# Patient Record
Sex: Male | Born: 1955 | Race: Black or African American | Hispanic: No | Marital: Married | Smoking: Former smoker
Health system: Southern US, Community
[De-identification: ages and names within clinical notes are randomized; demographics above are authoritative.]

## PROBLEM LIST (undated history)

## (undated) DIAGNOSIS — E7800 Pure hypercholesterolemia, unspecified: Secondary | ICD-10-CM

## (undated) DIAGNOSIS — Z87442 Personal history of urinary calculi: Secondary | ICD-10-CM

## (undated) DIAGNOSIS — I10 Essential (primary) hypertension: Secondary | ICD-10-CM

## (undated) DIAGNOSIS — G4700 Insomnia, unspecified: Secondary | ICD-10-CM

## (undated) HISTORY — PX: OTHER SURGICAL HISTORY: SHX169

## (undated) HISTORY — PX: NOSE SURGERY: SHX723

---

## 1977-10-18 HISTORY — PX: CYST EXCISION: SHX5701

## 2001-10-18 HISTORY — PX: HERNIA REPAIR: SHX51

## 2018-06-27 ENCOUNTER — Other Ambulatory Visit: Payer: Self-pay | Admitting: Urology

## 2018-07-03 ENCOUNTER — Encounter (HOSPITAL_COMMUNITY): Payer: Self-pay | Admitting: General Practice

## 2018-07-06 ENCOUNTER — Ambulatory Visit (HOSPITAL_COMMUNITY)
Admission: RE | Admit: 2018-07-06 | Discharge: 2018-07-06 | Disposition: A | Discharge status: Home / Self Care | Payer: Managed Care, Other (non HMO) | Source: Ambulatory Visit | Attending: Urology | Admitting: Urology

## 2018-07-06 ENCOUNTER — Other Ambulatory Visit: Payer: Self-pay

## 2018-07-06 ENCOUNTER — Encounter (HOSPITAL_COMMUNITY)
Admission: RE | Disposition: A | Discharge status: Home / Self Care | Payer: Self-pay | Source: Ambulatory Visit | Attending: Urology

## 2018-07-06 ENCOUNTER — Ambulatory Visit (HOSPITAL_COMMUNITY): Payer: Managed Care, Other (non HMO)

## 2018-07-06 ENCOUNTER — Encounter (HOSPITAL_COMMUNITY): Payer: Self-pay | Admitting: *Deleted

## 2018-07-06 DIAGNOSIS — N200 Calculus of kidney: Secondary | ICD-10-CM | POA: Insufficient documentation

## 2018-07-06 DIAGNOSIS — I499 Cardiac arrhythmia, unspecified: Secondary | ICD-10-CM | POA: Insufficient documentation

## 2018-07-06 DIAGNOSIS — I10 Essential (primary) hypertension: Secondary | ICD-10-CM | POA: Insufficient documentation

## 2018-07-06 DIAGNOSIS — Z79899 Other long term (current) drug therapy: Secondary | ICD-10-CM | POA: Diagnosis not present

## 2018-07-06 HISTORY — DX: Insomnia, unspecified: G47.00

## 2018-07-06 HISTORY — PX: EXTRACORPOREAL SHOCK WAVE LITHOTRIPSY: SHX1557

## 2018-07-06 HISTORY — DX: Pure hypercholesterolemia, unspecified: E78.00

## 2018-07-06 HISTORY — DX: Personal history of urinary calculi: Z87.442

## 2018-07-06 HISTORY — DX: Essential (primary) hypertension: I10

## 2018-07-06 SURGERY — LITHOTRIPSY, ESWL
Anesthesia: LOCAL | Laterality: Right

## 2018-07-06 MED ORDER — SODIUM CHLORIDE 0.9 % IV SOLN
INTRAVENOUS | Status: DC
  Administered 2018-07-06: 09:00:00 via INTRAVENOUS

## 2018-07-06 MED ORDER — DIPHENHYDRAMINE HCL 25 MG PO CAPS
25.0000 mg | ORAL_CAPSULE | ORAL | Status: AC
  Administered 2018-07-06: 25 mg via ORAL
  Filled 2018-07-06: qty 1

## 2018-07-06 MED ORDER — DIAZEPAM 5 MG PO TABS
10.0000 mg | ORAL_TABLET | ORAL | Status: AC
  Administered 2018-07-06: 10 mg via ORAL
  Filled 2018-07-06: qty 2

## 2018-07-06 MED ORDER — CIPROFLOXACIN HCL 500 MG PO TABS
500.0000 mg | ORAL_TABLET | ORAL | Status: AC
  Administered 2018-07-06: 500 mg via ORAL
  Filled 2018-07-06: qty 1

## 2018-07-10 ENCOUNTER — Encounter (HOSPITAL_COMMUNITY): Payer: Self-pay | Admitting: Urology

## 2019-08-01 HISTORY — PX: APPENDECTOMY: SHX54

## 2019-08-28 ENCOUNTER — Other Ambulatory Visit: Payer: Self-pay | Admitting: Urology

## 2019-08-29 ENCOUNTER — Encounter (HOSPITAL_COMMUNITY): Payer: Self-pay | Admitting: General Practice

## 2019-08-31 NOTE — H&P (Signed)
Office Visit Report     08/28/2019   --------------------------------------------------------------------------------   Bob Moore  MRN: [id number]  DOB: 1956-06-13, 63 year old Male  SSN:    PRIMARY CARE:  Stoney Bang  REFERRING:  Stoney Bang  PROVIDER:  Louis Meckel, M.D.  LOCATION:  [facility] [phone]     --------------------------------------------------------------------------------   CC: f/u for obstructing stone  HPI: Bob Moore is a 63 year-old male established patient who is here for further eval and management of an obstructing stone.  The patient was last seen July 2019.   The patient's stone is on his left side. The stone was 57mm left mid ureter. There are additional stones within the urinary tract. They are located 2 non-obstructing right lower pole stones.   The patient has not passed their stone since his visit. The patient denies fevers, chills, nausea, vomiting, flank pain, groin pain, or progressive voiding symptoms. The patient underwent KUB prior to today's appointment. The patient has been taking tamsulosin for their obstructing stone.   CT scan done on 10/14 for right lower quadrant pain. Found to have acute appendicitis and left obstructing stone. s/p lap appy on 10/14. The patient is having no symptoms of his left kidney stone. He never really has. It was basically an incidental finding.     ALLERGIES: None   MEDICATIONS: Simvastatin  Tamsulosin Hcl 0.4 mg capsule  Centrum Silver  Diltiazem 24Hr Er  Fish Oil 1,000 mg (120 mg-180 mg) capsule  Lo-Dose Aspirin Ec  Losartan Potassium  Tadalafil     GU PSH: ESWL, Right - 07/06/2018       PSH Notes: parotid gland [id number]), collapsed lung [id number]), hernia x2 (2003)   NON-GU PSH: Appendectomy (laparoscopic), 07/2019     GU PMH: Renal calculus - 09/28/2018, - 08/04/2018, - 07/20/2018 Ureteral calculus - 06/27/2018    NON-GU PMH:  Hypercholesterolemia Hypertension Liver Disease Stroke/TIA    FAMILY HISTORY: 2 sons - Son Diabetes - Mother Kidney Failure - Mother Lung Cancer - Father Prostate Cancer - Father   SOCIAL HISTORY: Marital Status: Married Preferred Language: English; Ethnicity: Not Hispanic Or Latino; Race: Black or African American Current Smoking Status: Patient does not smoke anymore. Has not smoked since 06/18/2005. Smoked for 25 years. Smoked 1/2 pack per day.   Tobacco Use Assessment Completed: Used Tobacco in last 30 days? Has never drank.  Drinks 1 caffeinated drink per day. Patient's occupation Scientist, clinical (histocompatibility and immunogenetics).    REVIEW OF SYSTEMS:    GU Review Male:   Patient denies frequent urination, hard to postpone urination, burning/ pain with urination, get up at night to urinate, leakage of urine, stream starts and stops, trouble starting your stream, have to strain to urinate , erection problems, and penile pain.  Gastrointestinal (Upper):   Patient denies indigestion/ heartburn, nausea, and vomiting.  Gastrointestinal (Lower):   Patient denies diarrhea and constipation.  Constitutional:   Patient denies fever, night sweats, weight loss, and fatigue.  Skin:   Patient denies skin rash/ lesion and itching.  Eyes:   Patient denies blurred vision and double vision.  Ears/ Nose/ Throat:   Patient denies sore throat and sinus problems.  Hematologic/Lymphatic:   Patient denies swollen glands and easy bruising.  Cardiovascular:   Patient denies leg swelling and chest pains.  Respiratory:   Patient denies cough and shortness of breath.  Endocrine:   Patient denies excessive thirst.  Musculoskeletal:   Patient denies back pain and joint pain.  Neurological:  Patient denies headaches and dizziness.  Psychologic:   Patient denies depression and anxiety.   VITAL SIGNS:      08/28/2019 10:11 AM  Weight 205 lb / 92.99 kg  Height 71 in / 180.34 cm  BP 153/95 mmHg  Pulse 60 /min  Temperature  97.3 F / 36.2 C  BMI 28.6 kg/m   MULTI-SYSTEM PHYSICAL EXAMINATION:    Constitutional: Well-nourished. No physical deformities. Normally developed. Good grooming.  Respiratory: Normal breath sounds. No labored breathing, no use of accessory muscles.   Cardiovascular: Regular rate and rhythm. No murmur, no gallop. Normal temperature, normal extremity pulses, no swelling, no varicosities.      PAST DATA REVIEWED:  Source Of History:  Patient  Lab Test Review:   Stone Analysis  Records Review:   Previous Doctor Records, Previous Hospital Records, Previous Patient Records, POC Tool  X-Ray Review: C.T. Abdomen/Pelvis: Reviewed Films. Discussed With Patient. 7 mm left mid ureteral stone, 2 nonobstructing stones in the right lower pole    PROCEDURES:         KUB - [id number]  A single view of the abdomen is obtained. Renal shadows are easily visualized bilaterally. There are no stones appreciated within the expected location in either renal pelvis. The patient's stone is overlying the L 3 transverse process on the left side. There are no additional calcifications along the expected location of either ureter bilaterally.  Gas pattern is grossly normal. No significant bony abnormalities.      Impression: The stone remains in the left mid ureter overlying the L3 transverse process Patient confirmed No Neulasta OnPro Device.            Urinalysis Dipstick Dipstick Cont'd  Color: Yellow Bilirubin: Neg mg/dL  Appearance: Clear Ketones: Neg mg/dL  Specific Gravity: 1.015 Blood: Neg ery/uL  pH: 6.5 Protein: Trace mg/dL  Glucose: Neg mg/dL Urobilinogen: 0.2 mg/dL    Nitrites: Neg    Leukocyte Esterase: Neg leu/uL    ASSESSMENT:      ICD-10 Details  1 GU:   Ureteral calculus - N20.1    PLAN:           Orders Labs Urine Culture  X-Rays: KUB          Schedule Return Visit/Planned Activity: ASAP - Schedule Surgery          Document Letter(s):  Created for Patient: Clinical Summary          Notes:   Patient has a 7 mm left mid ureteral stone. Fortunately, the patient is asymptomatic. He has done well with shockwave lithotripsy in the past, and as such we have opted to proceed in this manner. He understands the risks and the benefits. He has to stop his aspirin.   The patient also has 2 nonobstructing stones in the right, at this point we will continue to observe this.   We discussed management options including medical expulsion therapy, shockwave lithotripsy, and ureteroscopy. Ultimately, the patient has opted for shock wave lithotripsy. I discussed with the patient the procedure in detail as well as the risk and benefits. The patient is aware that she may need additional procedures. She also is aware of the risks of hematoma and pain. We will try to get this patient's scheduled as soon as possible.   CC: Stoney Bang, MD         Next Appointment:      Next Appointment: 09/03/2019 12:30 PM    Appointment Type: Surgery  Location: [facility] [phone]    Provider: Festus Aloe, M.D.    Reason for Visit: OP Dirk Dress LT ESWL      * Signed by Louis Meckel, M.D. on 08/28/19 at 4:22 PM (EST*     The information contained in this medical record document is considered private and confidential patient information. This information can only be used for the medical diagnosis and/or medical services that are being provided by the patient's selected caregivers. This information can only be distributed outside of the patient's care if the patient agrees and signs waivers of authorization for this information to be sent to an outside source or route.

## 2019-09-03 ENCOUNTER — Encounter (HOSPITAL_COMMUNITY)
Admission: RE | Disposition: A | Discharge status: Home / Self Care | Payer: Self-pay | Source: Other Acute Inpatient Hospital | Attending: Urology

## 2019-09-03 ENCOUNTER — Encounter (HOSPITAL_COMMUNITY): Payer: Self-pay | Admitting: General Practice

## 2019-09-03 ENCOUNTER — Ambulatory Visit (HOSPITAL_COMMUNITY)
Admission: RE | Admit: 2019-09-03 | Discharge: 2019-09-03 | Disposition: A | Discharge status: Home / Self Care | Payer: Managed Care, Other (non HMO) | Source: Other Acute Inpatient Hospital | Attending: Urology | Admitting: Urology

## 2019-09-03 ENCOUNTER — Telehealth (HOSPITAL_COMMUNITY): Payer: Self-pay | Admitting: *Deleted

## 2019-09-03 ENCOUNTER — Ambulatory Visit (HOSPITAL_COMMUNITY): Payer: Managed Care, Other (non HMO)

## 2019-09-03 DIAGNOSIS — Z87442 Personal history of urinary calculi: Secondary | ICD-10-CM | POA: Insufficient documentation

## 2019-09-03 DIAGNOSIS — Z79899 Other long term (current) drug therapy: Secondary | ICD-10-CM | POA: Insufficient documentation

## 2019-09-03 DIAGNOSIS — E7800 Pure hypercholesterolemia, unspecified: Secondary | ICD-10-CM | POA: Diagnosis not present

## 2019-09-03 DIAGNOSIS — I10 Essential (primary) hypertension: Secondary | ICD-10-CM | POA: Insufficient documentation

## 2019-09-03 DIAGNOSIS — K769 Liver disease, unspecified: Secondary | ICD-10-CM | POA: Diagnosis not present

## 2019-09-03 DIAGNOSIS — Z87891 Personal history of nicotine dependence: Secondary | ICD-10-CM | POA: Diagnosis not present

## 2019-09-03 DIAGNOSIS — Z7982 Long term (current) use of aspirin: Secondary | ICD-10-CM | POA: Insufficient documentation

## 2019-09-03 DIAGNOSIS — N200 Calculus of kidney: Secondary | ICD-10-CM | POA: Diagnosis not present

## 2019-09-03 DIAGNOSIS — N201 Calculus of ureter: Secondary | ICD-10-CM | POA: Insufficient documentation

## 2019-09-03 DIAGNOSIS — Z8673 Personal history of transient ischemic attack (TIA), and cerebral infarction without residual deficits: Secondary | ICD-10-CM | POA: Diagnosis not present

## 2019-09-03 HISTORY — PX: EXTRACORPOREAL SHOCK WAVE LITHOTRIPSY: SHX1557

## 2019-09-03 SURGERY — LITHOTRIPSY, ESWL
Anesthesia: LOCAL | Laterality: Left

## 2019-09-03 MED ORDER — CIPROFLOXACIN HCL 500 MG PO TABS
500.0000 mg | ORAL_TABLET | ORAL | Status: AC
  Administered 2019-09-03: 11:00:00 500 mg via ORAL
  Filled 2019-09-03: qty 1

## 2019-09-03 MED ORDER — DIAZEPAM 5 MG PO TABS
10.0000 mg | ORAL_TABLET | ORAL | Status: AC
  Administered 2019-09-03: 11:00:00 10 mg via ORAL
  Filled 2019-09-03: qty 2

## 2019-09-03 MED ORDER — SODIUM CHLORIDE 0.9 % IV SOLN
INTRAVENOUS | Status: DC
  Administered 2019-09-03: 11:00:00 via INTRAVENOUS

## 2019-09-03 MED ORDER — DIPHENHYDRAMINE HCL 25 MG PO CAPS
25.0000 mg | ORAL_CAPSULE | ORAL | Status: AC
  Administered 2019-09-03: 25 mg via ORAL
  Filled 2019-09-03: qty 1

## 2019-09-03 NOTE — Op Note (Signed)
Left 6 mm proximal stone   LEFT ESWL  Findings: Stone faded but did not disappear, he may need a staged procedure if he fails to pass the stone and or the fragments.

## 2019-09-03 NOTE — Interval H&P Note (Signed)
History and Physical Interval Note:  09/03/2019 11:13 AM  Bob Moore  has presented today for surgery, with the diagnosis of LEFT MID URETERAL STONE.  The various methods of treatment have been discussed with the patient and family. After consideration of risks, benefits and other options for treatment, the patient has consented to  Procedure(s): EXTRACORPOREAL SHOCK WAVE LITHOTRIPSY (ESWL) (Left) as a surgical intervention.  The patient's history has been reviewed, patient examined, no change in status, stable for surgery. He hasnt passed a stone. No dysuria or fever.  I have reviewed the patient's chart and labs.  Questions were answered to the patient's satisfaction.     Festus Aloe

## 2019-09-04 ENCOUNTER — Encounter (HOSPITAL_COMMUNITY): Payer: Self-pay | Admitting: Urology

## 2020-08-02 IMAGING — CR DG ABDOMEN 1V
2 series · 2 of 2 positions shown · non-contrast
Comparison: 08/28/2019

CLINICAL DATA: Pre litho

EXAM:
ABDOMEN - 1 VIEW

[t abdomen supine (1 of 2)]
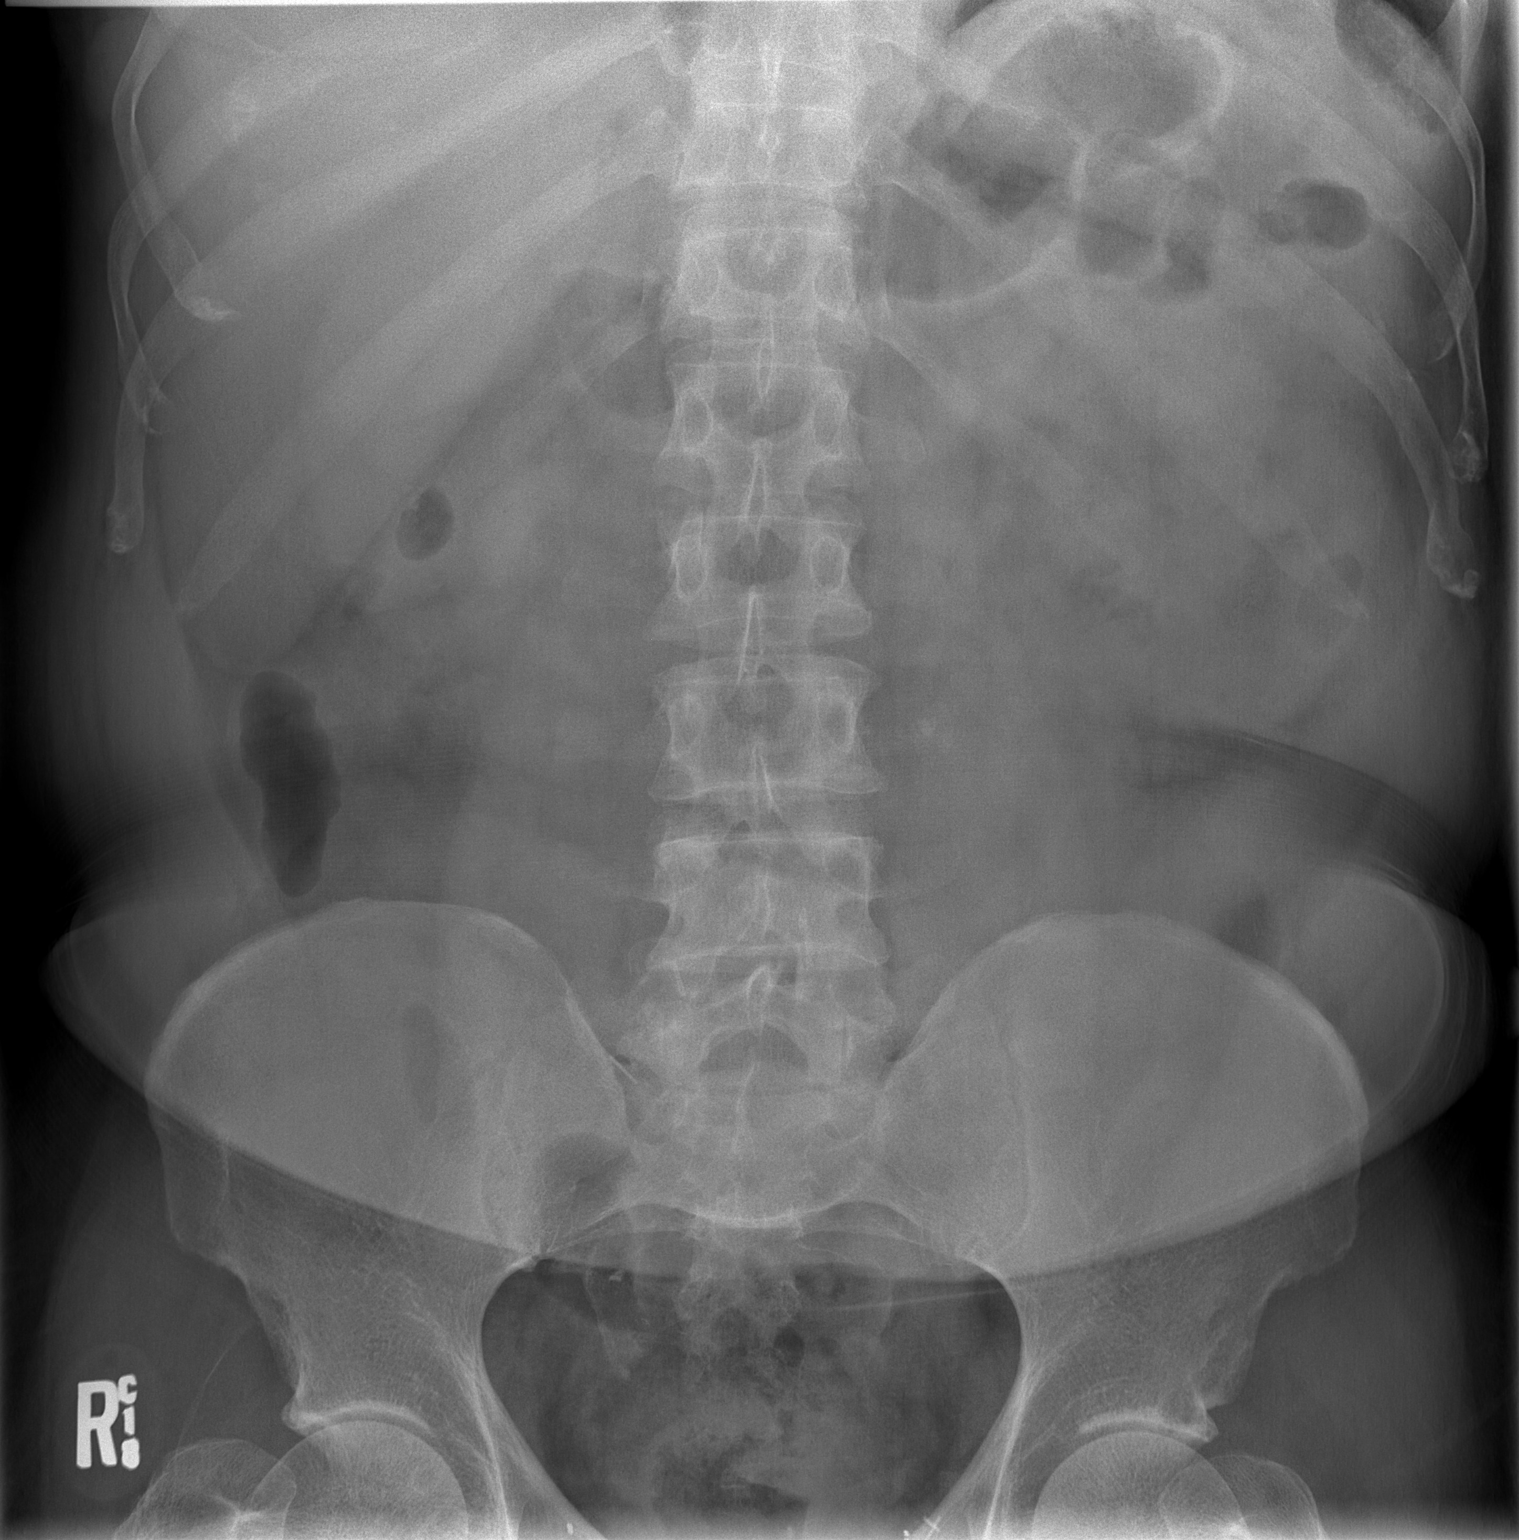

[t abdomen supine (2 of 2)]
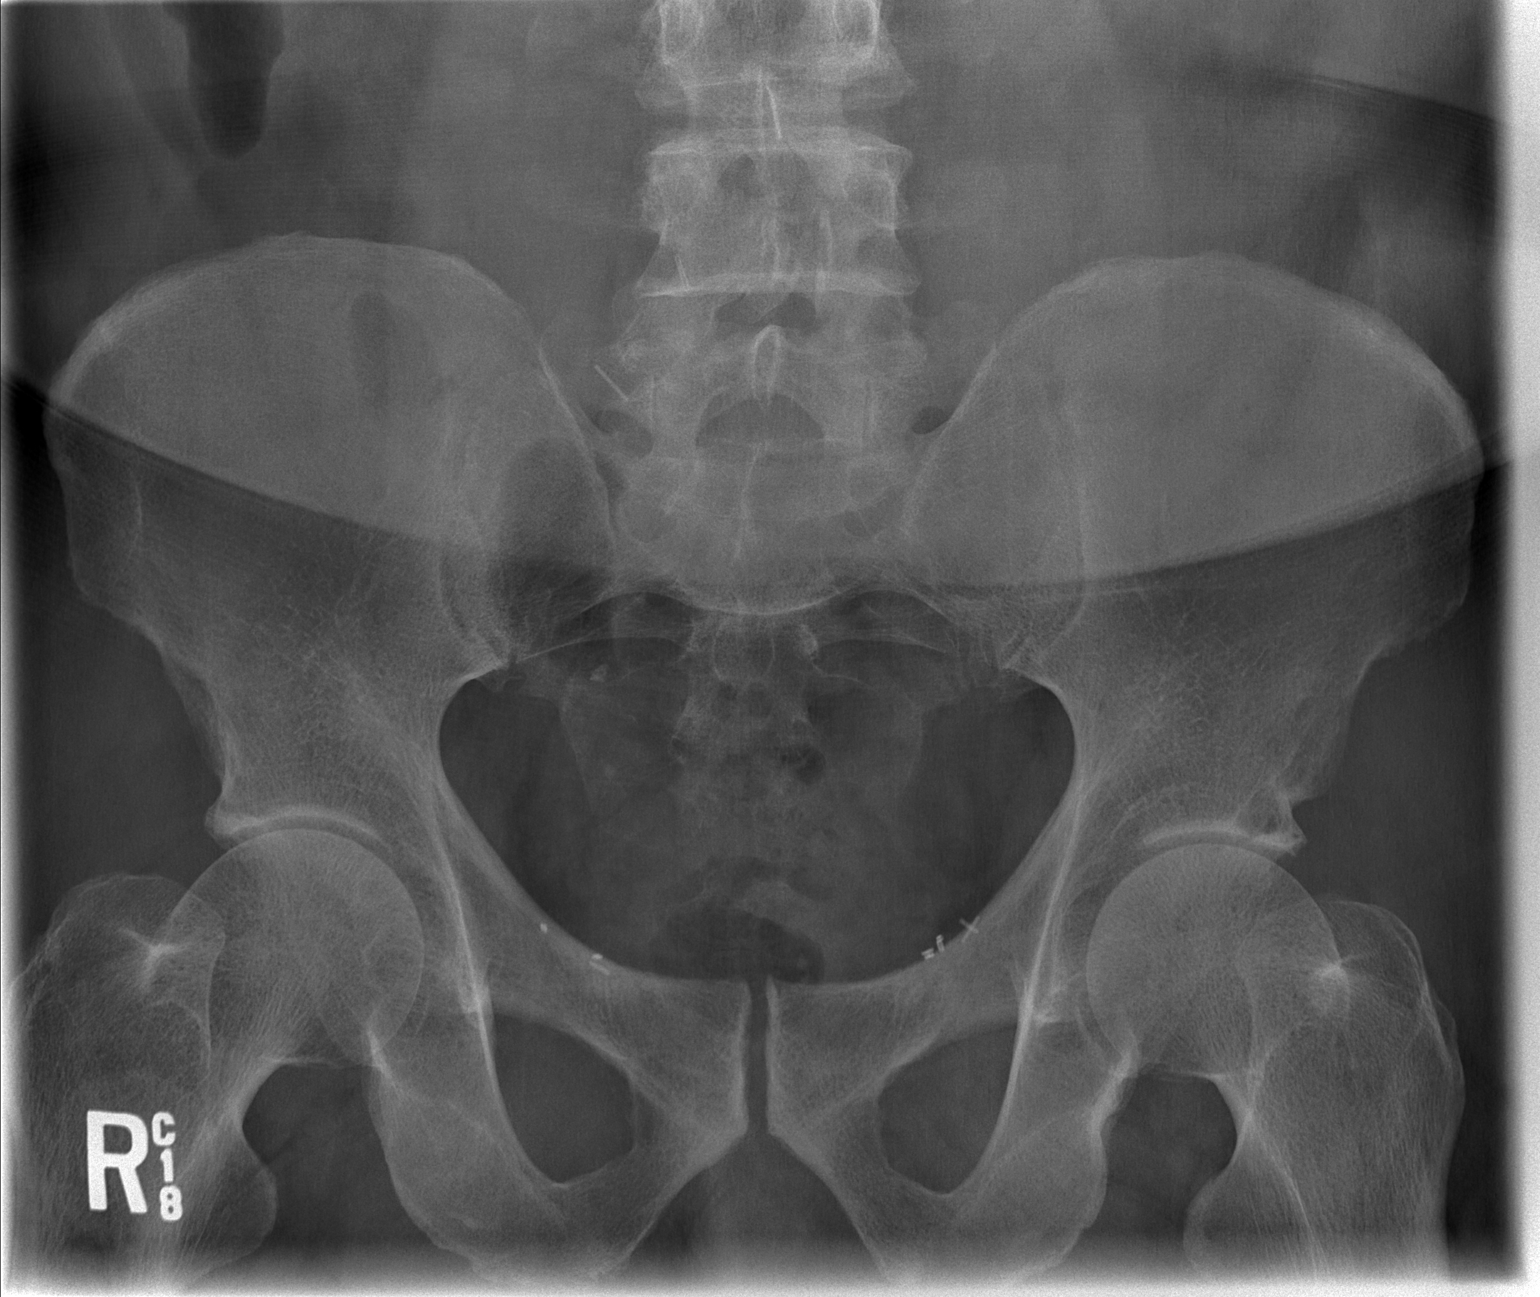

[2 of 2 positions shown; findings below may reference images not displayed]

FINDINGS: 5 mm calcification to the left of the L3 vertebral body is unchanged
in position from the prior study. This was shown to be within the
left ureter on prior CT of 09/01/2019 and appears unchanged from
that time.

No other urinary tract calculi identified. Small right renal calculi
seen on CT are not visualized on KUB.

Normal skeletal structures.  Normal bowel gas pattern.
IMPRESSION: 5 mm calculus mid left ureter at the L3 level unchanged.

## 2021-06-26 ENCOUNTER — Encounter (INDEPENDENT_AMBULATORY_CARE_PROVIDER_SITE_OTHER): Payer: Self-pay | Admitting: *Deleted

## 2021-08-11 ENCOUNTER — Telehealth (INDEPENDENT_AMBULATORY_CARE_PROVIDER_SITE_OTHER): Payer: Self-pay

## 2021-08-11 ENCOUNTER — Other Ambulatory Visit (INDEPENDENT_AMBULATORY_CARE_PROVIDER_SITE_OTHER): Payer: Self-pay

## 2021-08-11 DIAGNOSIS — Z1211 Encounter for screening for malignant neoplasm of colon: Secondary | ICD-10-CM

## 2021-08-11 NOTE — Telephone Encounter (Signed)
Referring MD/PCP: Hasanaj  Procedure: Tcs  Reason/Indication:  Screening  Has patient had this procedure before?  yes  If so, when, by whom and where? 2007   Is there a family history of colon cancer?  no  Who?  What age when diagnosed?    Is patient diabetic? If yes, Type 1 or Type 2   no      Does patient have prosthetic heart valve or mechanical valve?  no  Do you have a pacemaker/defibrillator?  no  Has patient ever had endocarditis/atrial fibrillation? no  Does patient use oxygen? no  Has patient had joint replacement within last 12 months?  no  Is patient constipated or do they take laxatives? no  Does patient have a history of alcohol/drug use?  yes  Have you had a stroke/heart attack last 6 mths? no  Do you take medicine for weight loss?  no  For male patients,: do you still have your menstrual cycle? N/A  Is patient on blood thinner such as Coumadin, Plavix and/or Aspirin? yes  Medications: asa 81 mg daily, Olmesartan/medoxomil 40 mg daily, rosuvastatin 20 mg daily, diltiazem 40 mg daily, tamsulosin 0.4 mg daily, MVI daily, fish oil 1000 mg daily  Allergies: nkda  Medication Adjustment per Dr Laural Golden Hold asa 81 mg 2 days prior  Procedure date & time: Wednesday 08/26/21 at 9:55

## 2021-08-12 ENCOUNTER — Encounter (INDEPENDENT_AMBULATORY_CARE_PROVIDER_SITE_OTHER): Payer: Self-pay

## 2021-08-12 ENCOUNTER — Telehealth (INDEPENDENT_AMBULATORY_CARE_PROVIDER_SITE_OTHER): Payer: Self-pay

## 2021-08-12 MED ORDER — PEG 3350-KCL-NA BICARB-NACL 420 G PO SOLR: 4000.0000 mL | ORAL | 0 refills | Status: DC

## 2021-08-12 NOTE — Telephone Encounter (Signed)
Bob Moore, CMA  

## 2021-08-19 NOTE — Patient Instructions (Addendum)
   Your procedure is scheduled on: 08/26/2021  Report to [facility] at   8:30  AM.  Call this number if you have problems the morning of surgery: [phone]   Remember:              Follow Directions on the letter you received from Your Physician's office regarding the Bowel Prep              No Smoking the day of Procedure :   Take these medicines the morning of surgery with A SIP OF WATER: Diltiazem and Flomax   Do not wear jewelry, make-up or nail polish.    Do not bring valuables to the hospital.  Contacts, dentures or bridgework may not be worn into surgery.  .   Patients discharged the day of surgery will not be allowed to drive home.     Colonoscopy, Adult, Care After This sheet gives you information about how to care for yourself after your procedure. Your health care provider may also give you more specific instructions. If you have problems or questions, contact your health care provider. What can I expect after the procedure? After the procedure, it is common to have: A small amount of blood in your stool for 24 hours after the procedure. Some gas. Mild abdominal cramping or bloating.  Follow these instructions at home: General instructions  For the first 24 hours after the procedure: Do not drive or use machinery. Do not sign important documents. Do not drink alcohol. Do your regular daily activities at a slower pace than normal. Eat soft, easy-to-digest foods. Rest often. Take over-the-counter or prescription medicines only as told by your health care provider. It is up to you to get the results of your procedure. Ask your health care provider, or the department performing the procedure, when your results will be ready. Relieving cramping and bloating Try walking around when you have cramps or feel bloated. Apply heat to your abdomen as told by your health care provider. Use a heat source that your health care provider recommends, such as a moist heat pack or  a heating pad. Place a towel between your skin and the heat source. Leave the heat on for 20-30 minutes. Remove the heat if your skin turns bright red. This is especially important if you are unable to feel pain, heat, or cold. You may have a greater risk of getting burned. Eating and drinking Drink enough fluid to keep your urine clear or pale yellow. Resume your normal diet as instructed by your health care provider. Avoid heavy or fried foods that are hard to digest. Avoid drinking alcohol for as long as instructed by your health care provider. Contact a health care provider if: You have blood in your stool 2-3 days after the procedure. Get help right away if: You have more than a small spotting of blood in your stool. You pass large blood clots in your stool. Your abdomen is swollen. You have nausea or vomiting. You have a fever. You have increasing abdominal pain that is not relieved with medicine. This information is not intended to replace advice given to you by your health care provider. Make sure you discuss any questions you have with your health care provider. Document Released: 05/18/2004 Document Revised: 06/28/2016 Document Reviewed: 12/16/2015 Elsevier Interactive Patient Education  [employer].

## 2021-08-21 ENCOUNTER — Encounter (HOSPITAL_COMMUNITY)
Admission: RE | Admit: 2021-08-21 | Discharge: 2021-08-21 | Disposition: A | Discharge status: Home / Self Care | Payer: Medicare Other | Source: Ambulatory Visit | Attending: Internal Medicine | Admitting: Internal Medicine

## 2021-08-21 ENCOUNTER — Encounter (HOSPITAL_COMMUNITY): Payer: Self-pay

## 2021-08-21 ENCOUNTER — Other Ambulatory Visit: Payer: Self-pay

## 2021-08-21 DIAGNOSIS — Z01810 Encounter for preprocedural cardiovascular examination: Secondary | ICD-10-CM | POA: Diagnosis present

## 2021-08-21 DIAGNOSIS — Z1211 Encounter for screening for malignant neoplasm of colon: Secondary | ICD-10-CM

## 2021-08-24 ENCOUNTER — Other Ambulatory Visit (INDEPENDENT_AMBULATORY_CARE_PROVIDER_SITE_OTHER): Payer: Self-pay

## 2021-08-26 ENCOUNTER — Encounter (HOSPITAL_COMMUNITY)
Admission: RE | Disposition: A | Discharge status: Home / Self Care | Payer: Self-pay | Source: Home / Self Care | Attending: Internal Medicine

## 2021-08-26 ENCOUNTER — Ambulatory Visit (HOSPITAL_COMMUNITY)
Admission: RE | Admit: 2021-08-26 | Discharge: 2021-08-26 | Disposition: A | Discharge status: Home / Self Care | Payer: Medicare Other | Attending: Internal Medicine | Admitting: Internal Medicine

## 2021-08-26 ENCOUNTER — Encounter (HOSPITAL_COMMUNITY): Payer: Self-pay | Admitting: Internal Medicine

## 2021-08-26 ENCOUNTER — Ambulatory Visit (HOSPITAL_COMMUNITY): Payer: Medicare Other | Admitting: Anesthesiology

## 2021-08-26 DIAGNOSIS — Z7982 Long term (current) use of aspirin: Secondary | ICD-10-CM | POA: Diagnosis not present

## 2021-08-26 DIAGNOSIS — Z1211 Encounter for screening for malignant neoplasm of colon: Secondary | ICD-10-CM | POA: Diagnosis not present

## 2021-08-26 DIAGNOSIS — Z87891 Personal history of nicotine dependence: Secondary | ICD-10-CM | POA: Diagnosis not present

## 2021-08-26 DIAGNOSIS — K644 Residual hemorrhoidal skin tags: Secondary | ICD-10-CM | POA: Insufficient documentation

## 2021-08-26 DIAGNOSIS — D125 Benign neoplasm of sigmoid colon: Secondary | ICD-10-CM | POA: Diagnosis not present

## 2021-08-26 DIAGNOSIS — D124 Benign neoplasm of descending colon: Secondary | ICD-10-CM

## 2021-08-26 DIAGNOSIS — D123 Benign neoplasm of transverse colon: Secondary | ICD-10-CM | POA: Diagnosis not present

## 2021-08-26 DIAGNOSIS — K5730 Diverticulosis of large intestine without perforation or abscess without bleeding: Secondary | ICD-10-CM | POA: Insufficient documentation

## 2021-08-26 DIAGNOSIS — K6289 Other specified diseases of anus and rectum: Secondary | ICD-10-CM

## 2021-08-26 HISTORY — PX: COLONOSCOPY WITH PROPOFOL: SHX5780

## 2021-08-26 HISTORY — PX: POLYPECTOMY: SHX5525

## 2021-08-26 LAB — HM COLONOSCOPY

## 2021-08-26 SURGERY — COLONOSCOPY WITH PROPOFOL
Anesthesia: General

## 2021-08-26 MED ORDER — PROPOFOL 500 MG/50ML IV EMUL
INTRAVENOUS | Status: DC | PRN
  Administered 2021-08-26: 150 ug/kg/min via INTRAVENOUS

## 2021-08-26 MED ORDER — PROPOFOL 10 MG/ML IV BOLUS
INTRAVENOUS | Status: DC | PRN
  Administered 2021-08-26: 40 mg via INTRAVENOUS
  Administered 2021-08-26: 100 mg via INTRAVENOUS

## 2021-08-26 MED ORDER — LACTATED RINGERS IV SOLN: INTRAVENOUS | Status: DC

## 2021-08-26 NOTE — H&P (Signed)
Bob Moore is an 65 y.o. male.   Chief Complaint: Patient is here for colonoscopy HPI:  patient is 65 year old Afro-American male who is here for screening colonoscopy.  Last exam 15 years ago was normal.  He denies abdominal pain change in bowel habits or rectal bleeding.  Family history is negative for colon carcinoma.   Last aspirin dose was 2 days ago.  Past Medical History:  Diagnosis Date   High cholesterol    History of kidney stones    Hypertension    Insomnia     Past Surgical History:  Procedure Laterality Date   APPENDECTOMY  08/01/2019   collapsed lung     chest tube   colonscopy  2005-06?   CYST EXCISION Left 1979   cyst removed from [place] LITHOTRIPSY Right 07/06/2018   Procedure: RIGHT EXTRACORPOREAL SHOCK WAVE LITHOTRIPSY (ESWL);  Surgeon: Cleon Gustin, MD;  Location: [facility];  Service: Urology;  Laterality: Right;   EXTRACORPOREAL SHOCK WAVE LITHOTRIPSY Left 09/03/2019   Procedure: EXTRACORPOREAL SHOCK WAVE LITHOTRIPSY (ESWL);  Surgeon: Festus Aloe, MD;  Location: [facility];  Service: Urology;  Laterality: Left;   HERNIA REPAIR  2003   double   NOSE SURGERY  1986-87?    History reviewed. No pertinent family history. Social History:  reports that he has quit smoking. He has never used smokeless tobacco. He reports that he does not drink alcohol and does not use drugs.  Allergies:  Allergies  Allergen Reactions   Other Other (See Comments)    Dairy products causes reflux    Medications Prior to Admission  Medication Sig Dispense Refill   aspirin EC 81 MG tablet Take 81 mg by mouth daily.     diltiazem (CARDIZEM CD) 240 MG 24 hr capsule Take 240 mg by mouth daily.     Multiple Vitamins-Minerals (MULTIVITAMIN WITH MINERALS) tablet Take 1 tablet by mouth daily. Men 50 +     olmesartan (BENICAR) 40 MG tablet Take 40 mg by mouth daily.     Omega-3 Fatty Acids (FISH OIL) 1000 MG CAPS Take 1,000 mg by mouth daily.      polyethylene glycol-electrolytes (TRILYTE) 420 g solution Take 4,000 mLs by mouth as directed. 4000 mL 0   rosuvastatin (CRESTOR) 20 MG tablet Take 20 mg by mouth daily.     tadalafil (CIALIS) 5 MG tablet Take 5 mg by mouth as needed for erectile dysfunction.     tamsulosin (FLOMAX) 0.4 MG CAPS capsule Take 0.4 mg by mouth daily after supper.      No results found for this or any previous visit (from the past 48 hour(s)). No results found.  Review of Systems  Blood pressure (!) 154/87, pulse (!) 48, temperature 98.1 F (36.7 C), temperature source Oral, resp. rate 18, SpO2 100 %. Physical Exam HENT:     Mouth/Throat:     Mouth: Mucous membranes are moist.     Pharynx: Oropharynx is clear.  Eyes:     General: No scleral icterus.    Conjunctiva/sclera: Conjunctivae normal.  Cardiovascular:     Rate and Rhythm: Normal rate and regular rhythm.     Heart sounds: Normal heart sounds. No murmur heard. Pulmonary:     Effort: Pulmonary effort is normal.     Breath sounds: Normal breath sounds.  Abdominal:     General: There is no distension.     Palpations: Abdomen is soft. There is no mass.     Tenderness:  There is no abdominal tenderness.  Musculoskeletal:     Cervical back: Neck supple.  Lymphadenopathy:     Cervical: No cervical adenopathy.  Neurological:     Mental Status: He is alert.     Assessment/Plan  Average risk screening colonoscopy  Hildred Laser, MD 08/26/2021, 10:27 AM

## 2021-08-26 NOTE — Op Note (Signed)
[facility] Patient Name: Bob Moore Procedure Date: 08/26/2021 10:19 AM MRN: [id number] Date of Birth: [birth year] Attending MD: Hildred Laser , MD CSN: [id number] Age: 65 Admit Type: Outpatient Procedure:                Colonoscopy Indications:              Screening for colorectal malignant neoplasm Providers:                Hildred Laser, MD, Caprice Kluver, Randa Spike,                            Technician Referring MD:             Stoney Bang, MD Medicines:                Propofol per Anesthesia Complications:            No immediate complications. Estimated Blood Loss:     Estimated blood loss was minimal. Procedure:                Pre-Anesthesia Assessment:                           - Prior to the procedure, a History and Physical                            was performed, and patient medications and                            allergies were reviewed. The patient's tolerance of                            previous anesthesia was also reviewed. The risks                            and benefits of the procedure and the sedation                            options and risks were discussed with the patient.                            All questions were answered, and informed consent                            was obtained. Prior Anticoagulants: The patient has                            taken no previous anticoagulant or antiplatelet                            agents except for aspirin. ASA Grade Assessment: II                            - A patient with mild systemic disease. After  reviewing the risks and benefits, the patient was                            deemed in satisfactory condition to undergo the                            procedure.                           After obtaining informed consent, the colonoscope                            was passed under direct vision. Throughout the                            procedure, the patient's  blood pressure, pulse, and                            oxygen saturations were monitored continuously. The                            [id number]-[id number] ([id number]) scope was introduced through                            the anus and advanced to the the cecum, identified                            by appendiceal orifice and ileocecal valve. The                            colonoscopy was performed without difficulty. The                            patient tolerated the procedure well. The quality                            of the bowel preparation was good. The ileocecal                            valve, appendiceal orifice, and rectum were                            photographed. Scope In: 10:35:05 AM Scope Out: 10:57:26 AM Scope Withdrawal Time: 0 hours 17 minutes 20 seconds  Total Procedure Duration: 0 hours 22 minutes 21 seconds  Findings:      The perianal and digital rectal examinations were normal.      Three polyps were found in the sigmoid colon, descending colon and       transverse colon. The polyps were 5 to 8 mm in size. These polyps were       removed with a cold snare. Resection and retrieval were complete. The       pathology specimen was placed into Bottle Number 3.      Scattered diverticula were found in the sigmoid colon.      External hemorrhoids were  found during retroflexion. The hemorrhoids       were small.      Anal papilla(e) were hypertrophied. Impression:               - Three 5 to 8 mm polyps in the sigmoid colon, in                            the descending colon and in the transverse colon,                            removed with a cold snare. Resected and retrieved.                           - Diverticulosis in the sigmoid colon.                           - External hemorrhoids.                           - Anal papilla(e) were hypertrophied. Moderate Sedation:      Per Anesthesia Care Recommendation:           - Patient has a contact number available for                             emergencies. The signs and symptoms of potential                            delayed complications were discussed with the                            patient. Return to normal activities tomorrow.                            Written discharge instructions were provided to the                            patient.                           - High fiber diet today.                           - Continue present medications.                           - No aspirin, ibuprofen, naproxen, or other                            non-steroidal anti-inflammatory drugs for 1 day.                           - Await pathology results.                           - Repeat colonoscopy is recommended. The  colonoscopy date will be determined after pathology                            results from today's exam become available for                            review. Procedure Code(s):        --- Professional ---                           [phone], Colonoscopy, flexible; with removal of                            tumor(s), polyp(s), or other lesion(s) by snare                            technique Diagnosis Code(s):        --- Professional ---                           Z12.11, Encounter for screening for malignant                            neoplasm of colon                           K63.5, Polyp of colon                           K64.4, Residual hemorrhoidal skin tags                           K62.89, Other specified diseases of anus and rectum                           K57.30, Diverticulosis of large intestine without                            perforation or abscess without bleeding CPT copyright 2019 American Medical Association. All rights reserved. The codes documented in this report are preliminary and upon coder review may  be revised to meet current compliance requirements. Hildred Laser, MD Hildred Laser, MD 08/26/2021 11:07:32 AM This report has been signed  electronically. Number of Addenda: 0

## 2021-08-26 NOTE — Transfer of Care (Signed)
Immediate Anesthesia Transfer of Care Note  Patient: Bob Moore  Procedure(s) Performed: COLONOSCOPY WITH PROPOFOL POLYPECTOMY  Patient Location: Short Stay  Anesthesia Type:General  Level of Consciousness: awake and alert   Airway & Oxygen Therapy: Patient Spontanous Breathing  Post-op Assessment: Report given to RN and Post -op Vital signs reviewed and stable  Post vital signs: Reviewed and stable  Last Vitals:  Vitals Value Taken Time  BP 143/75 08/26/21 1103  Temp 36.7 C 08/26/21 1103  Pulse 63 08/26/21 1103  Resp 16 08/26/21 1103  SpO2 99 % 08/26/21 1103    Last Pain:  Vitals:   08/26/21 1103  TempSrc: Axillary  PainSc: 0-No pain      Patients Stated Pain Goal: 5 (32/44/01 0272)  Complications: No notable events documented.

## 2021-08-26 NOTE — Discharge Instructions (Addendum)
No aspirin or NSAIDs for 24 hours Resume other medications as before. High-fiber diet. No driving for 24 hours. Physician will call with biopsy results.

## 2021-08-26 NOTE — Anesthesia Postprocedure Evaluation (Signed)
Anesthesia Post Note  Patient: Bob Moore  Procedure(s) Performed: COLONOSCOPY WITH PROPOFOL POLYPECTOMY  Patient location during evaluation: Phase II Anesthesia Type: General Level of consciousness: awake and alert and oriented Pain management: pain level controlled Vital Signs Assessment: post-procedure vital signs reviewed and stable Respiratory status: spontaneous breathing and respiratory function stable Cardiovascular status: blood pressure returned to baseline and stable Postop Assessment: no apparent nausea or vomiting Anesthetic complications: no   No notable events documented.   Last Vitals:  Vitals:   08/26/21 0922 08/26/21 1103  BP: (!) 154/87 (!) 143/75  Pulse: (!) 48 63  Resp: 18 16  Temp: 36.7 C 36.7 C  SpO2: 100% 99%    Last Pain:  Vitals:   08/26/21 1103  TempSrc: Axillary  PainSc: 0-No pain                 Randell Detter C Shivon Hackel

## 2021-08-26 NOTE — Anesthesia Preprocedure Evaluation (Signed)
Anesthesia Evaluation  Patient identified by MRN, date of birth, ID band Patient awake    Reviewed: Allergy & Precautions, NPO status , Patient's Chart, lab work & pertinent test results  History of Anesthesia Complications Negative for: history of anesthetic complications  Airway Mallampati: II  TM Distance: >3 FB Neck ROM: Full    Dental  (+) Dental Advisory Given, Teeth Intact   Pulmonary former smoker,    Pulmonary exam normal breath sounds clear to auscultation       Cardiovascular Exercise Tolerance: Good hypertension, Pt. on medications Normal cardiovascular exam Rhythm:Regular Rate:Normal     Neuro/Psych negative neurological ROS  negative psych ROS   GI/Hepatic negative GI ROS, Neg liver ROS,   Endo/Other  negative endocrine ROS  Renal/GU negative Renal ROS     Musculoskeletal negative musculoskeletal ROS (+)   Abdominal   Peds  Hematology negative hematology ROS (+)   Anesthesia Other Findings   Reproductive/Obstetrics negative OB ROS                             Anesthesia Physical Anesthesia Plan  ASA: 2  Anesthesia Plan: General   Post-op Pain Management:    Induction: Intravenous  PONV Risk Score and Plan: TIVA  Airway Management Planned: Nasal Cannula and Natural Airway  Additional Equipment:   Intra-op Plan:   Post-operative Plan:   Informed Consent: I have reviewed the patients History and Physical, chart, labs and discussed the procedure including the risks, benefits and alternatives for the proposed anesthesia with the patient or authorized representative who has indicated his/her understanding and acceptance.     Dental advisory given  Plan Discussed with: CRNA and Surgeon  Anesthesia Plan Comments:         Anesthesia Quick Evaluation

## 2021-08-28 LAB — SURGICAL PATHOLOGY

## 2021-08-31 ENCOUNTER — Encounter (INDEPENDENT_AMBULATORY_CARE_PROVIDER_SITE_OTHER): Payer: Self-pay | Admitting: *Deleted

## 2021-08-31 ENCOUNTER — Encounter (HOSPITAL_COMMUNITY): Payer: Self-pay | Admitting: Internal Medicine

## 2021-12-29 DIAGNOSIS — M8589 Other specified disorders of bone density and structure, multiple sites: Secondary | ICD-10-CM | POA: Diagnosis not present

## 2021-12-29 DIAGNOSIS — M810 Age-related osteoporosis without current pathological fracture: Secondary | ICD-10-CM | POA: Diagnosis not present

## 2021-12-31 DIAGNOSIS — N400 Enlarged prostate without lower urinary tract symptoms: Secondary | ICD-10-CM | POA: Diagnosis not present

## 2021-12-31 DIAGNOSIS — I700 Atherosclerosis of aorta: Secondary | ICD-10-CM | POA: Diagnosis not present

## 2021-12-31 DIAGNOSIS — I10 Essential (primary) hypertension: Secondary | ICD-10-CM | POA: Diagnosis not present

## 2021-12-31 DIAGNOSIS — K219 Gastro-esophageal reflux disease without esophagitis: Secondary | ICD-10-CM | POA: Diagnosis not present

## 2021-12-31 DIAGNOSIS — Z6828 Body mass index (BMI) 28.0-28.9, adult: Secondary | ICD-10-CM | POA: Diagnosis not present

## 2021-12-31 DIAGNOSIS — E7849 Other hyperlipidemia: Secondary | ICD-10-CM | POA: Diagnosis not present

## 2021-12-31 DIAGNOSIS — J329 Chronic sinusitis, unspecified: Secondary | ICD-10-CM | POA: Diagnosis not present

## 2022-04-05 DIAGNOSIS — N400 Enlarged prostate without lower urinary tract symptoms: Secondary | ICD-10-CM | POA: Diagnosis not present

## 2022-04-05 DIAGNOSIS — E7849 Other hyperlipidemia: Secondary | ICD-10-CM | POA: Diagnosis not present

## 2022-04-05 DIAGNOSIS — Z6827 Body mass index (BMI) 27.0-27.9, adult: Secondary | ICD-10-CM | POA: Diagnosis not present

## 2022-04-05 DIAGNOSIS — K219 Gastro-esophageal reflux disease without esophagitis: Secondary | ICD-10-CM | POA: Diagnosis not present

## 2022-04-05 DIAGNOSIS — I700 Atherosclerosis of aorta: Secondary | ICD-10-CM | POA: Diagnosis not present

## 2022-04-05 DIAGNOSIS — Z0000 Encounter for general adult medical examination without abnormal findings: Secondary | ICD-10-CM | POA: Diagnosis not present

## 2022-04-05 DIAGNOSIS — I10 Essential (primary) hypertension: Secondary | ICD-10-CM | POA: Diagnosis not present

## 2022-04-05 DIAGNOSIS — N23 Unspecified renal colic: Secondary | ICD-10-CM | POA: Diagnosis not present

## 2022-04-09 DIAGNOSIS — N281 Cyst of kidney, acquired: Secondary | ICD-10-CM | POA: Diagnosis not present

## 2022-04-09 DIAGNOSIS — I700 Atherosclerosis of aorta: Secondary | ICD-10-CM | POA: Diagnosis not present

## 2022-04-09 DIAGNOSIS — N200 Calculus of kidney: Secondary | ICD-10-CM | POA: Diagnosis not present

## 2022-06-23 ENCOUNTER — Telehealth: Payer: Self-pay | Admitting: *Deleted

## 2022-06-23 NOTE — Patient Outreach (Signed)
  Care Coordination   06/23/2022 Name: Bob Moore MRN: [id number] DOB: October 18, 1956   Care Coordination Outreach Attempts:  An unsuccessful telephone outreach was attempted today to offer the patient information about available care coordination services as a benefit of their health plan.   Follow Up Plan:  Additional outreach attempts will be made to offer the patient care coordination information and services.   Encounter Outcome:  No Answer  Care Coordination Interventions Activated:  No   Care Coordination Interventions:  No, not indicated    Valente David, RN, MSN, [facility] Care Management Care Management Coordinator [phone]

## 2022-06-28 ENCOUNTER — Telehealth: Payer: Self-pay | Admitting: *Deleted

## 2022-06-28 NOTE — Patient Outreach (Signed)
  Care Coordination   06/28/2022 Name: Bob Moore MRN: [id number] DOB: Nov 20, 1955   Care Coordination Outreach Attempts:  A second unsuccessful outreach was attempted today to offer the patient with information about available care coordination services as a benefit of their health plan.     Follow Up Plan:  Additional outreach attempts will be made to offer the patient care coordination information and services.   Encounter Outcome:  No Answer  Care Coordination Interventions Activated:  No   Care Coordination Interventions:  No, not indicated    Valente David, RN, MSN, [facility] Care Management Care Management Coordinator [phone]

## 2022-07-01 ENCOUNTER — Telehealth: Payer: Self-pay | Admitting: *Deleted

## 2022-07-01 NOTE — Patient Outreach (Signed)
  Care Coordination   07/01/2022 Name: Bob Moore MRN: [id number] DOB: 30-Mar-1956   Care Coordination Outreach Attempts:  A third unsuccessful outreach was attempted today to offer the patient with information about available care coordination services as a benefit of their health plan.   Follow Up Plan:  No further outreach attempts will be made at this time. We have been unable to contact the patient to offer or enroll patient in care coordination services  Encounter Outcome:  No Answer  Care Coordination Interventions Activated:  No   Care Coordination Interventions:  No, not indicated    Valente David, RN, MSN, [facility] Care Management Care Management Coordinator [phone]

## 2022-07-12 DIAGNOSIS — N400 Enlarged prostate without lower urinary tract symptoms: Secondary | ICD-10-CM | POA: Diagnosis not present

## 2022-07-12 DIAGNOSIS — E7849 Other hyperlipidemia: Secondary | ICD-10-CM | POA: Diagnosis not present

## 2022-07-12 DIAGNOSIS — I10 Essential (primary) hypertension: Secondary | ICD-10-CM | POA: Diagnosis not present

## 2022-07-12 DIAGNOSIS — I700 Atherosclerosis of aorta: Secondary | ICD-10-CM | POA: Diagnosis not present

## 2022-07-12 DIAGNOSIS — Z0000 Encounter for general adult medical examination without abnormal findings: Secondary | ICD-10-CM | POA: Diagnosis not present

## 2022-07-12 DIAGNOSIS — K219 Gastro-esophageal reflux disease without esophagitis: Secondary | ICD-10-CM | POA: Diagnosis not present

## 2022-07-12 DIAGNOSIS — Z6828 Body mass index (BMI) 28.0-28.9, adult: Secondary | ICD-10-CM | POA: Diagnosis not present

## 2022-10-25 DIAGNOSIS — I10 Essential (primary) hypertension: Secondary | ICD-10-CM | POA: Diagnosis not present

## 2022-10-25 DIAGNOSIS — Z6828 Body mass index (BMI) 28.0-28.9, adult: Secondary | ICD-10-CM | POA: Diagnosis not present

## 2022-10-25 DIAGNOSIS — E7849 Other hyperlipidemia: Secondary | ICD-10-CM | POA: Diagnosis not present

## 2022-10-25 DIAGNOSIS — N400 Enlarged prostate without lower urinary tract symptoms: Secondary | ICD-10-CM | POA: Diagnosis not present

## 2022-10-25 DIAGNOSIS — K219 Gastro-esophageal reflux disease without esophagitis: Secondary | ICD-10-CM | POA: Diagnosis not present

## 2022-10-25 DIAGNOSIS — Z125 Encounter for screening for malignant neoplasm of prostate: Secondary | ICD-10-CM | POA: Diagnosis not present

## 2022-10-25 DIAGNOSIS — Z0000 Encounter for general adult medical examination without abnormal findings: Secondary | ICD-10-CM | POA: Diagnosis not present

## 2022-10-25 DIAGNOSIS — I700 Atherosclerosis of aorta: Secondary | ICD-10-CM | POA: Diagnosis not present

## 2023-01-17 DIAGNOSIS — Z0000 Encounter for general adult medical examination without abnormal findings: Secondary | ICD-10-CM | POA: Diagnosis not present

## 2023-01-17 DIAGNOSIS — E7849 Other hyperlipidemia: Secondary | ICD-10-CM | POA: Diagnosis not present

## 2023-01-17 DIAGNOSIS — I10 Essential (primary) hypertension: Secondary | ICD-10-CM | POA: Diagnosis not present

## 2023-01-17 DIAGNOSIS — N400 Enlarged prostate without lower urinary tract symptoms: Secondary | ICD-10-CM | POA: Diagnosis not present

## 2023-01-17 DIAGNOSIS — Z6828 Body mass index (BMI) 28.0-28.9, adult: Secondary | ICD-10-CM | POA: Diagnosis not present

## 2023-01-17 DIAGNOSIS — K219 Gastro-esophageal reflux disease without esophagitis: Secondary | ICD-10-CM | POA: Diagnosis not present

## 2023-01-17 DIAGNOSIS — I700 Atherosclerosis of aorta: Secondary | ICD-10-CM | POA: Diagnosis not present

## 2023-04-25 DIAGNOSIS — K219 Gastro-esophageal reflux disease without esophagitis: Secondary | ICD-10-CM | POA: Diagnosis not present

## 2023-04-25 DIAGNOSIS — J3089 Other allergic rhinitis: Secondary | ICD-10-CM | POA: Diagnosis not present

## 2023-04-25 DIAGNOSIS — E7849 Other hyperlipidemia: Secondary | ICD-10-CM | POA: Diagnosis not present

## 2023-04-25 DIAGNOSIS — I700 Atherosclerosis of aorta: Secondary | ICD-10-CM | POA: Diagnosis not present

## 2023-04-25 DIAGNOSIS — I10 Essential (primary) hypertension: Secondary | ICD-10-CM | POA: Diagnosis not present

## 2023-04-25 DIAGNOSIS — N400 Enlarged prostate without lower urinary tract symptoms: Secondary | ICD-10-CM | POA: Diagnosis not present

## 2023-04-25 DIAGNOSIS — Z0000 Encounter for general adult medical examination without abnormal findings: Secondary | ICD-10-CM | POA: Diagnosis not present

## 2023-04-25 DIAGNOSIS — Z6827 Body mass index (BMI) 27.0-27.9, adult: Secondary | ICD-10-CM | POA: Diagnosis not present

## 2023-07-25 DIAGNOSIS — E7849 Other hyperlipidemia: Secondary | ICD-10-CM | POA: Diagnosis not present

## 2023-07-25 DIAGNOSIS — Z6827 Body mass index (BMI) 27.0-27.9, adult: Secondary | ICD-10-CM | POA: Diagnosis not present

## 2023-07-25 DIAGNOSIS — I10 Essential (primary) hypertension: Secondary | ICD-10-CM | POA: Diagnosis not present

## 2023-07-25 DIAGNOSIS — K219 Gastro-esophageal reflux disease without esophagitis: Secondary | ICD-10-CM | POA: Diagnosis not present

## 2023-07-25 DIAGNOSIS — J3089 Other allergic rhinitis: Secondary | ICD-10-CM | POA: Diagnosis not present

## 2023-07-25 DIAGNOSIS — N182 Chronic kidney disease, stage 2 (mild): Secondary | ICD-10-CM | POA: Diagnosis not present

## 2023-07-25 DIAGNOSIS — I700 Atherosclerosis of aorta: Secondary | ICD-10-CM | POA: Diagnosis not present

## 2023-07-25 DIAGNOSIS — N400 Enlarged prostate without lower urinary tract symptoms: Secondary | ICD-10-CM | POA: Diagnosis not present

## 2023-11-08 ENCOUNTER — Ambulatory Visit: Payer: Medicare Other | Admitting: Urology

## 2023-11-08 VITALS — BP 141/85 | HR 74

## 2023-11-08 DIAGNOSIS — N401 Enlarged prostate with lower urinary tract symptoms: Secondary | ICD-10-CM | POA: Diagnosis not present

## 2023-11-08 DIAGNOSIS — R310 Gross hematuria: Secondary | ICD-10-CM | POA: Diagnosis not present

## 2023-11-08 DIAGNOSIS — N138 Other obstructive and reflux uropathy: Secondary | ICD-10-CM

## 2023-11-08 DIAGNOSIS — R339 Retention of urine, unspecified: Secondary | ICD-10-CM | POA: Diagnosis not present

## 2023-11-08 MED ORDER — FINASTERIDE 5 MG PO TABS: 5.0000 mg | ORAL_TABLET | Freq: Every day | ORAL | 11 refills | Status: DC

## 2023-11-08 MED ORDER — OXYBUTYNIN CHLORIDE 5 MG PO TABS
ORAL_TABLET | ORAL | 1 refills | Status: AC
Start: 2023-11-08 — End: ?

## 2023-11-08 NOTE — Progress Notes (Signed)
11/08/2023 10:55 AM   Bob Moore 06-Feb-1956 [id number]  Referring provider: Toma Deiters, MD [address] [place],  [place] [address]  No chief complaint on file.   HPI: New patient visit for this gentleman who was seen in the emergency room yesterday for urinary retention.  He was seen in [place].  Presenting with abdominal pain and low urinary output.  Foley catheter was placed with at least 1 L of urine obtained.  He has had hematuria since that time.  He did have CT scan which revealed no hydronephrosis.  He does have baseline BPH.  He has been on daily Cialis as well as alfuzosin.  He was having slowing of his urinary stream for a few weeks before his presentation yesterday.  He has intermittent spasms and urine leaking around the catheter.    PMH: Past Medical History:  Diagnosis Date   High cholesterol    History of kidney stones    Hypertension    Insomnia     Surgical History: Past Surgical History:  Procedure Laterality Date   APPENDECTOMY  08/01/2019   collapsed lung     chest tube   COLONOSCOPY WITH PROPOFOL N/A 08/26/2021   Procedure: COLONOSCOPY WITH PROPOFOL;  Surgeon: Malissa Hippo, MD;  [facility];  Service: Endoscopy;  Laterality: N/A;  9:55   colonscopy  2005-06?   CYST EXCISION Left 1979   cyst removed from cartoid   EXTRACORPOREAL SHOCK WAVE LITHOTRIPSY Right 07/06/2018   Procedure: RIGHT EXTRACORPOREAL SHOCK WAVE LITHOTRIPSY (ESWL);  Surgeon: Malen Gauze, MD;  [facility]: [facility];  Service: Urology;  Laterality: Right;   EXTRACORPOREAL SHOCK WAVE LITHOTRIPSY Left 09/03/2019   Procedure: EXTRACORPOREAL SHOCK WAVE LITHOTRIPSY (ESWL);  Surgeon: Jerilee Field, MD;  Location: [facility];  Service: Urology;  Laterality: Left;   HERNIA REPAIR  2003   double   NOSE SURGERY  1986-87?   POLYPECTOMY  08/26/2021   Procedure: POLYPECTOMY;  Surgeon: Malissa Hippo, MD;  [facility];  Service: Endoscopy;;    Home  Medications:  Allergies as of 11/08/2023       Reactions   Other Other (See Comments)   Dairy products causes reflux        Medication List        Accurate as of November 08, 2023 10:55 AM. If you have any questions, ask your nurse or doctor.          aspirin EC 81 MG tablet Take 1 tablet (81 mg total) by mouth daily.   diltiazem 240 MG 24 hr capsule Commonly known as: CARDIZEM CD Take 240 mg by mouth daily.   Fish Oil 1000 MG Caps Take 1,000 mg by mouth daily.   multivitamin with minerals tablet Take 1 tablet by mouth daily. Men 50 +   olmesartan 40 MG tablet Commonly known as: BENICAR Take 40 mg by mouth daily.   rosuvastatin 20 MG tablet Commonly known as: CRESTOR Take 20 mg by mouth daily.   tadalafil 5 MG tablet Commonly known as: CIALIS Take 5 mg by mouth as needed for erectile dysfunction.   tamsulosin 0.4 MG Caps capsule Commonly known as: FLOMAX Take 0.4 mg by mouth daily after supper.        Allergies:  Allergies  Allergen Reactions   Other Other (See Comments)    Dairy products causes reflux    Family History: No family history on file.  Social History:  reports that he has  quit smoking. He has never used smokeless tobacco. He reports that he does not drink alcohol and does not use drugs.  ROS: All other review of systems were reviewed and are negative except what is noted above in HPI  Physical Exam: There were no vitals taken for this visit.  Constitutional:  Alert and oriented, No acute distress. HEENT: [place] AT, moist mucus membranes.  Trachea midline, no masses. Cardiovascular: No clubbing, cyanosis, or edema. Respiratory: Normal respiratory effort, no increased work of breathing. GU: Foley is positioned well based on CT findings as well as visual inspection.  Penis is normal.  Scrotal skin and testicles normal. Lymph: No cervical or inguinal lymphadenopathy. Skin: No rashes, bruises or suspicious lesions. Neurologic: Grossly  intact, no focal deficits, moving all 4 extremities. Psychiatric: Normal mood and affect.  Laboratory Data: CBC, CMP and urinalysis from his emergency room visit were reviewed  Pertinent Imaging: CT scan images were reviewed.  Prostate volume estimate 138 mL.  There are bilateral renal calculi.  Graphic reading:   1. Bilateral nonobstructing renal calculi, right greater than left. No obstructive uropathy within either kidney. 2. Enlarged prostate. 3. Trace right pleural effusion. 4. Aortic Atherosclerosis (ICD10-I70.0).    Bladder was irrigated with 1000 cc of saline.  No clots obtained.   Assessment: -BPH with urinary retention, catheter in place  -Incomplete emptying of bladder.  -Gross hematuria, most likely secondary to urothelial irritation  Plan: -I added finasteride daily, he will double up on his alfuzosin and I also will give him as needed oxybutynin for his bladder spasms.  Also given finasteride.  He was told to hold off on that 2 days before his appointment next week with me  -We sent him home with irrigation kit if there is worry of clots  I will have him come back in in 1 week for T OV  No follow-ups on file.  Chelsea Aus, MD  [facility] [place]

## 2023-11-09 ENCOUNTER — Telehealth: Payer: Self-pay

## 2023-11-09 NOTE — Telephone Encounter (Signed)
Pt wife called to ask how to flush her husbands catheter, she was advised how to remove flush and out on new catheter as well as to let her know if she had any questions to give [place] a call back

## 2023-11-13 NOTE — Progress Notes (Signed)
11/15/2023 10:57 AM   Bob Moore 1956-09-01 [id number]  Referring provider: Toma Deiters, MD [address] [place],  [place] [address]  No chief complaint on file.   HPI: This gentleman was originally seen last week and urinary retention with recent catheter placement in the emergency room.  Additionally, he was having gross hematuria.  His catheter was irrigated in the office.  He was told to stay off aspirin, started on finasteride.  He went to his PCP last Thursday because he was having bladder pain with catheter in.  The catheter was removed.  He immediately passed lots of clots.  He has not had hematuria since that time although his urine has been slightly cloudy.  No dysuria, no fever.  He is having a bit of a slow stream.  Still on dual medical therapy with finasteride and alfuzosin.      PMH: Past Medical History:  Diagnosis Date   High cholesterol    History of kidney stones    Hypertension    Insomnia     Surgical History: Past Surgical History:  Procedure Laterality Date   APPENDECTOMY  08/01/2019   collapsed lung     chest tube   COLONOSCOPY WITH PROPOFOL N/A 08/26/2021   Procedure: COLONOSCOPY WITH PROPOFOL;  Surgeon: Malissa Hippo, MD;  [facility];  Service: Endoscopy;  Laterality: N/A;  9:55   colonscopy  2005-06?   CYST EXCISION Left 1979   cyst removed from cartoid   EXTRACORPOREAL SHOCK WAVE LITHOTRIPSY Right 07/06/2018   Procedure: RIGHT EXTRACORPOREAL SHOCK WAVE LITHOTRIPSY (ESWL);  Surgeon: Malen Gauze, MD;  [facility];  Service: Urology;  Laterality: Right;   EXTRACORPOREAL SHOCK WAVE LITHOTRIPSY Left 09/03/2019   Procedure: EXTRACORPOREAL SHOCK WAVE LITHOTRIPSY (ESWL);  Surgeon: Jerilee Field, MD;  [facility];  Service: Urology;  Laterality: Left;   HERNIA REPAIR  2003   double   NOSE SURGERY  1986-87?   POLYPECTOMY  08/26/2021   Procedure: POLYPECTOMY;  Surgeon: Malissa Hippo, MD;  [facility];  Service: Endoscopy;;    Home Medications:  Allergies as of 11/15/2023       Reactions   Other Other (See Comments)   Dairy products causes reflux        Medication List        Accurate as of November 13, 2023 10:57 AM. If you have any questions, ask your nurse or doctor.          aspirin EC 81 MG tablet Take 1 tablet (81 mg total) by mouth daily.   diltiazem 240 MG 24 hr capsule Commonly known as: CARDIZEM CD Take 240 mg by mouth daily.   finasteride 5 MG tablet Commonly known as: PROSCAR Take 1 tablet (5 mg total) by mouth daily.   Fish Oil 1000 MG Caps Take 1,000 mg by mouth daily.   multivitamin with minerals tablet Take 1 tablet by mouth daily. Men 50 +   olmesartan 40 MG tablet Commonly known as: BENICAR Take 40 mg by mouth daily.   oxybutynin 5 MG tablet Commonly known as: DITROPAN 1 tablet p.o. every 8 hours as needed bladder spasms   rosuvastatin 20 MG tablet Commonly known as: CRESTOR Take 20 mg by mouth daily.   tadalafil 5 MG tablet Commonly known as: CIALIS Take 5 mg by mouth as needed for erectile dysfunction.   tamsulosin 0.4 MG Caps capsule Commonly known as: FLOMAX Take 0.4 mg by mouth  daily after supper.        Allergies:  Allergies  Allergen Reactions   Other Other (See Comments)    Dairy products causes reflux    Family History: No family history on file.  Social History:  reports that he has quit smoking. He has never used smokeless tobacco. He reports that he does not drink alcohol and does not use drugs.  ROS: All other review of systems were reviewed and are negative except what is noted above in HPI  Physical Exam: There were no vitals taken for this visit.  Constitutional:  Alert and oriented, No acute distress. HEENT: [place] AT, moist mucus membranes.  Trachea midline, no masses. Cardiovascular: No clubbing, cyanosis, or edema. Respiratory: Normal respiratory effort, no increased work of  breathing. GU: Normal external genitalia.  Normal anal sphincter tone.  Prostate 100 g, symmetric, nonnodular, nontender. Neurologic: Grossly intact, no focal deficits, moving all 4 extremities. Psychiatric: Normal mood and affect.  Laboratory Data: CBC, CMP and urinalysis from his emergency room visit were reviewed  Pertinent Imaging: CT scan images were reviewed.  Prostate volume estimate 138 mL.  There are bilateral renal calculi.  Graphic reading:  1. Bilateral nonobstructing renal calculi, right greater than left. No obstructive uropathy within either kidney. 2. Enlarged prostate. 3. Trace right pleural effusion. 4. Aortic Atherosclerosis (ICD10-I70.0).    Bladder scan performed today-volume 50 mL.   Assessment: -BPH with urinary retention, catheter now out, patient emptying fairly well  -Gross hematuria, resolved  Plan:  -I put him on prophylactic Keflex x 3 days  -Continue dual medical therapy  -I will see him back in 6 weeks for routine check

## 2023-11-15 ENCOUNTER — Ambulatory Visit: Payer: Medicare Other | Admitting: Urology

## 2023-11-15 ENCOUNTER — Encounter: Payer: Self-pay | Admitting: Urology

## 2023-11-15 VITALS — BP 185/85 | HR 51

## 2023-11-15 DIAGNOSIS — R339 Retention of urine, unspecified: Secondary | ICD-10-CM

## 2023-11-15 DIAGNOSIS — Z87898 Personal history of other specified conditions: Secondary | ICD-10-CM

## 2023-11-15 DIAGNOSIS — N401 Enlarged prostate with lower urinary tract symptoms: Secondary | ICD-10-CM | POA: Diagnosis not present

## 2023-11-15 DIAGNOSIS — R310 Gross hematuria: Secondary | ICD-10-CM

## 2023-11-15 DIAGNOSIS — N138 Other obstructive and reflux uropathy: Secondary | ICD-10-CM

## 2023-11-15 MED ORDER — CEPHALEXIN 500 MG PO CAPS: 500.0000 mg | ORAL_CAPSULE | Freq: Two times a day (BID) | ORAL | 0 refills | Status: AC

## 2023-12-26 NOTE — Progress Notes (Signed)
 12/27/2023 12:37 PM   Bob Moore 1956/05/21 [id number]  Referring provider: Toma Deiters, MD [address] [place],  [place] [address]  No chief complaint on file.   HPI: This gentleman was originally seen last week and urinary retention with recent catheter placement in the emergency room.  Additionally, he was having gross hematuria.  His catheter was irrigated in the office.  He was told to stay off aspirin, started on finasteride.  He went to his PCP last Thursday because he was having bladder pain with catheter in.  The catheter was removed.  He immediately passed lots of clots.  He has not had hematuria since that time although his urine has been slightly cloudy.  No dysuria, no fever.  He is having a bit of a slow stream.  Still on dual medical therapy with finasteride and alfuzosin.      PMH: Past Medical History:  Diagnosis Date   High cholesterol    History of kidney stones    Hypertension    Insomnia     Surgical History: Past Surgical History:  Procedure Laterality Date   APPENDECTOMY  08/01/2019   collapsed lung     chest tube   COLONOSCOPY WITH PROPOFOL N/A 08/26/2021   Procedure: COLONOSCOPY WITH PROPOFOL;  Surgeon: Malissa Hippo, MD;  [facility];  Service: Endoscopy;  Laterality: N/A;  9:55   colonscopy  2005-06?   CYST EXCISION Left 1979   cyst removed from cartoid   EXTRACORPOREAL SHOCK WAVE LITHOTRIPSY Right 07/06/2018   Procedure: RIGHT EXTRACORPOREAL SHOCK WAVE LITHOTRIPSY (ESWL);  Surgeon: Malen Gauze, MD;  [facility];  Service: Urology;  Laterality: Right;   EXTRACORPOREAL SHOCK WAVE LITHOTRIPSY Left 09/03/2019   Procedure: EXTRACORPOREAL SHOCK WAVE LITHOTRIPSY (ESWL);  Surgeon: Jerilee Field, MD;  [facility]: [facility];  Service: Urology;  Laterality: Left;   HERNIA REPAIR  2003   double   NOSE SURGERY  1986-87?   POLYPECTOMY  08/26/2021   Procedure: POLYPECTOMY;  Surgeon: Malissa Hippo, MD;  [facility];  Service: Endoscopy;;    Home Medications:  Allergies as of 12/27/2023       Reactions   Other Other (See Comments)   Dairy products causes reflux        Medication List        Accurate as of December 26, 2023 12:37 PM. If you have any questions, ask your nurse or doctor.          aspirin EC 81 MG tablet Take 1 tablet (81 mg total) by mouth daily.   diltiazem 240 MG 24 hr capsule Commonly known as: CARDIZEM CD Take 240 mg by mouth daily.   finasteride 5 MG tablet Commonly known as: PROSCAR Take 1 tablet (5 mg total) by mouth daily.   Fish Oil 1000 MG Caps Take 1,000 mg by mouth daily.   multivitamin with minerals tablet Take 1 tablet by mouth daily. Men 50 +   olmesartan 40 MG tablet Commonly known as: BENICAR Take 40 mg by mouth daily.   oxybutynin 5 MG tablet Commonly known as: DITROPAN 1 tablet p.o. every 8 hours as needed bladder spasms   rosuvastatin 20 MG tablet Commonly known as: CRESTOR Take 20 mg by mouth daily.   tadalafil 5 MG tablet Commonly known as: CIALIS Take 5 mg by mouth as needed for erectile dysfunction.   tamsulosin 0.4 MG Caps capsule Commonly known as: FLOMAX Take 0.4 mg by mouth  daily after supper.        Allergies:  Allergies  Allergen Reactions   Other Other (See Comments)    Dairy products causes reflux    Family History: No family history on file.  Social History:  reports that he has quit smoking. He has never used smokeless tobacco. He reports that he does not drink alcohol and does not use drugs.  ROS: All other review of systems were reviewed and are negative except what is noted above in HPI  Physical Exam: There were no vitals taken for this visit.  Constitutional:  Alert and oriented, No acute distress. HEENT: [place] AT, moist mucus membranes.  Trachea midline, no masses. Cardiovascular: No clubbing, cyanosis, or edema. Respiratory: Normal respiratory effort, no increased work of  breathing. GU: Normal external genitalia.  Normal anal sphincter tone.  Prostate 100 g, symmetric, nonnodular, nontender. Neurologic: Grossly intact, no focal deficits, moving all 4 extremities. Psychiatric: Normal mood and affect.  Laboratory Data: CBC, CMP and urinalysis from his emergency room visit were reviewed  Pertinent Imaging: CT scan images were reviewed.  Prostate volume estimate 138 mL.  There are bilateral renal calculi.  Graphic reading:  1. Bilateral nonobstructing renal calculi, right greater than left. No obstructive uropathy within either kidney. 2. Enlarged prostate. 3. Trace right pleural effusion. 4. Aortic Atherosclerosis (ICD10-I70.0).    Bladder scan performed today-volume 50 mL.   Assessment: -BPH with urinary retention, catheter now out, patient emptying fairly well  -Gross hematuria, resolved  Plan:

## 2023-12-27 ENCOUNTER — Ambulatory Visit: Payer: Medicare Other | Admitting: Urology

## 2023-12-27 VITALS — BP 177/87 | HR 60

## 2023-12-27 DIAGNOSIS — R310 Gross hematuria: Secondary | ICD-10-CM

## 2023-12-27 DIAGNOSIS — Z87898 Personal history of other specified conditions: Secondary | ICD-10-CM | POA: Diagnosis not present

## 2023-12-27 DIAGNOSIS — R339 Retention of urine, unspecified: Secondary | ICD-10-CM | POA: Diagnosis not present

## 2023-12-27 DIAGNOSIS — N401 Enlarged prostate with lower urinary tract symptoms: Secondary | ICD-10-CM

## 2023-12-27 DIAGNOSIS — N138 Other obstructive and reflux uropathy: Secondary | ICD-10-CM

## 2023-12-27 LAB — URINALYSIS, ROUTINE W REFLEX MICROSCOPIC
Bilirubin, UA: NEGATIVE
Glucose, UA: NEGATIVE
Ketones, UA: NEGATIVE
Nitrite, UA: NEGATIVE
Protein,UA: NEGATIVE
RBC, UA: NEGATIVE
Specific Gravity, UA: 1.02 (ref 1.005–1.030)
Urobilinogen, Ur: 0.2 mg/dL (ref 0.2–1.0)
pH, UA: 7.5 (ref 5.0–7.5)

## 2023-12-27 LAB — MICROSCOPIC EXAMINATION: Bacteria, UA: NONE SEEN

## 2023-12-27 NOTE — Progress Notes (Signed)
 Bladder Scan completed today.  Patient can void prior to the bladder scan. Bladder scan result: 99  Performed By: Alfonse Spruce. CMA  Additional notes-

## 2024-10-25 ENCOUNTER — Other Ambulatory Visit: Payer: Self-pay | Admitting: Urology

## 2024-10-25 DIAGNOSIS — N138 Other obstructive and reflux uropathy: Secondary | ICD-10-CM

## 2024-12-25 ENCOUNTER — Ambulatory Visit: Admitting: Urology
